# Patient Record
Sex: Female | Born: 1960 | Race: White | Hispanic: No | Marital: Single
Health system: Southern US, Community
[De-identification: ages and names within clinical notes are randomized; demographics above are authoritative.]

---

## 2005-03-25 ENCOUNTER — Ambulatory Visit: Payer: Self-pay

## 2005-07-23 ENCOUNTER — Ambulatory Visit: Payer: Self-pay | Admitting: Pain Medicine

## 2005-08-07 ENCOUNTER — Ambulatory Visit: Payer: Self-pay | Admitting: Pain Medicine

## 2005-09-11 ENCOUNTER — Ambulatory Visit: Payer: Self-pay | Admitting: Pain Medicine

## 2006-07-12 ENCOUNTER — Emergency Department: Payer: Self-pay | Admitting: Emergency Medicine

## 2006-09-16 IMAGING — CR DG LUMBAR SPINE AP/LAT/OBLIQUES W/ FLEX AND EXT
1 series · 5 of 5 positions shown · non-contrast
Comparison: none

REASON FOR EXAM: PAIN
COMMENTS:

[Series 1: view not recorded · 0.17mm/px · 5 of 5 slices shown]
[im 1/5]
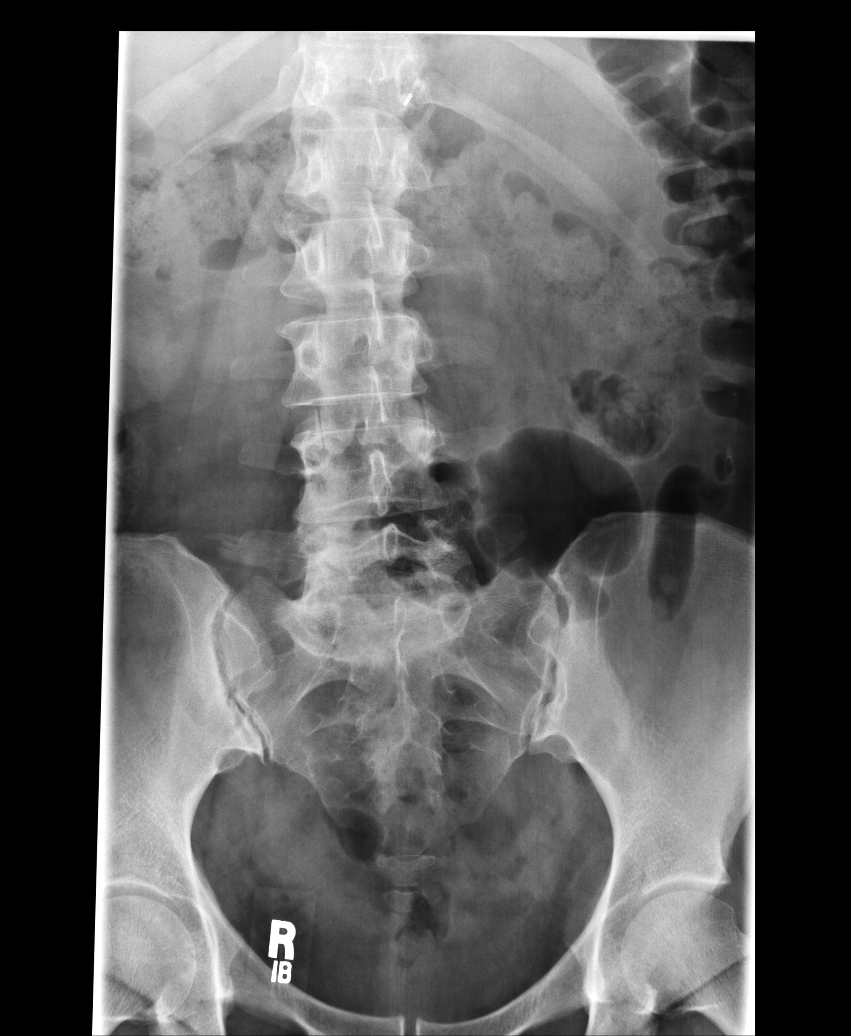
[im 2/5]
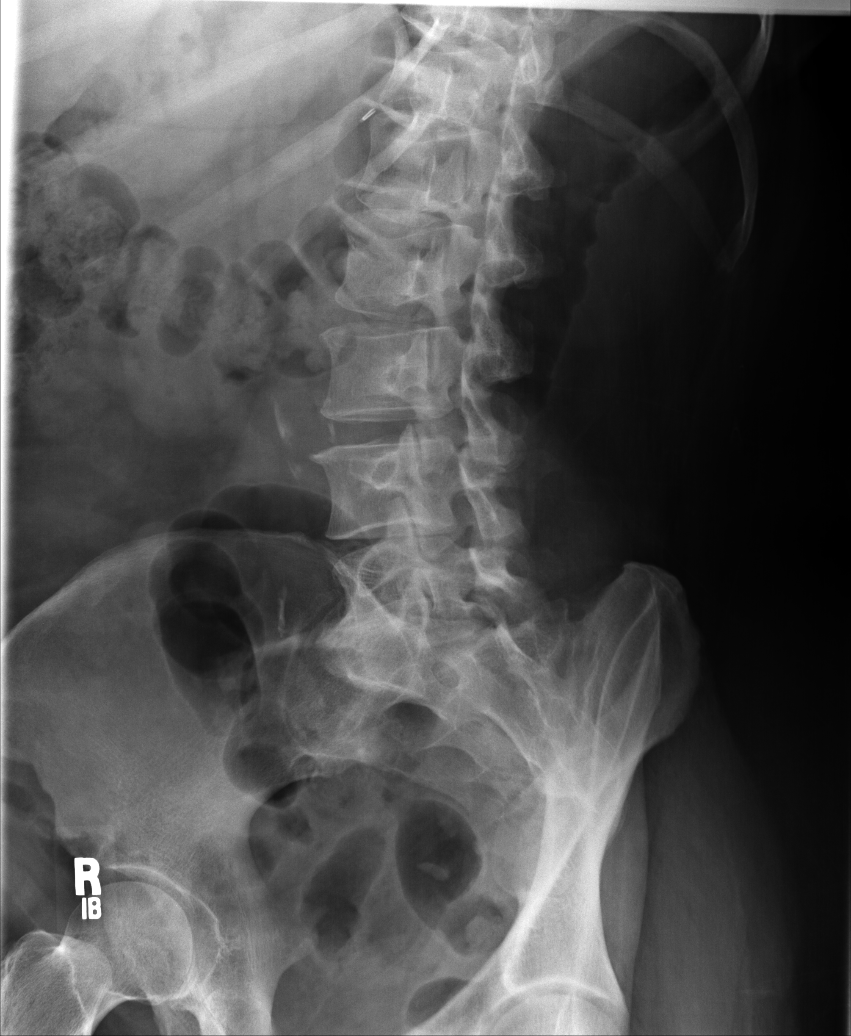
[im 3/5]
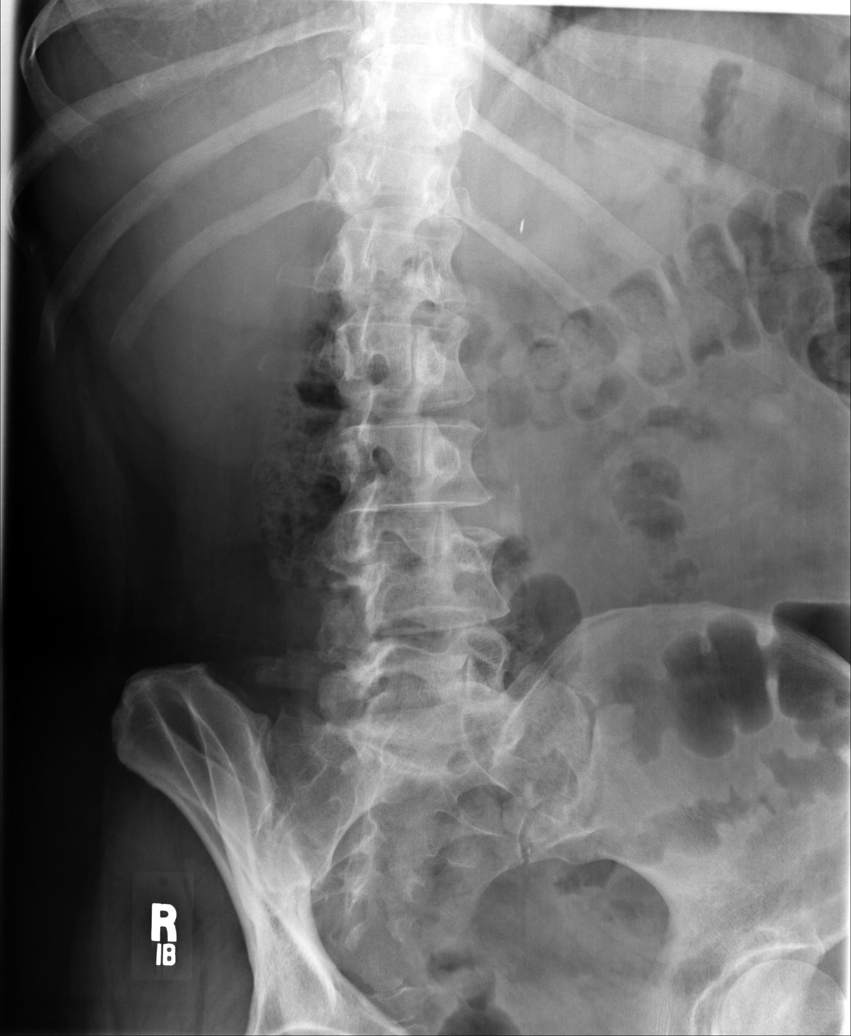
[im 4/5]
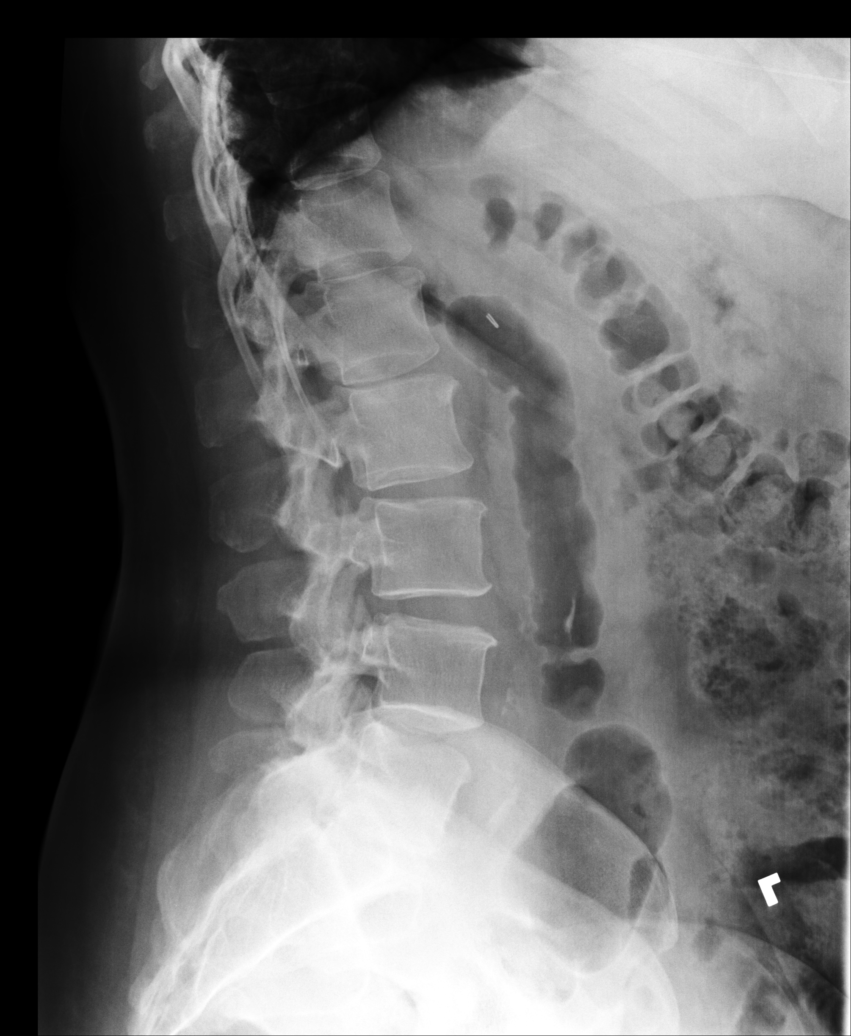
[im 5/5]
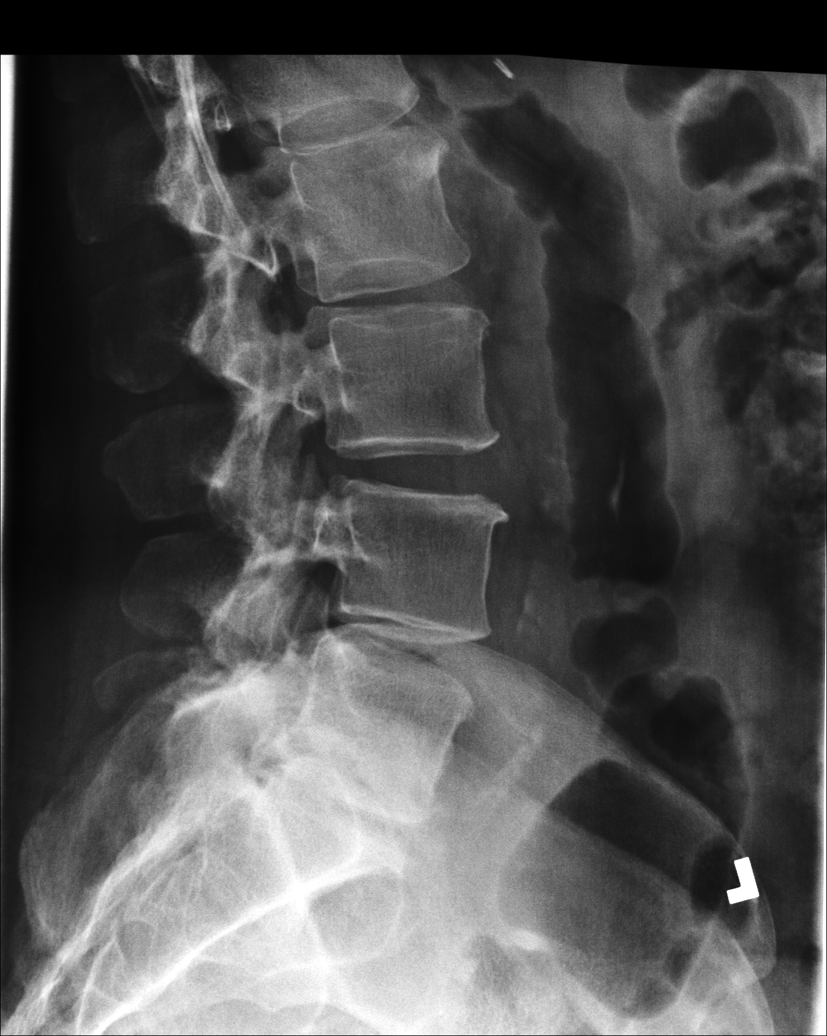

[5 of 5 positions shown; findings below may reference images not displayed]

PROCEDURE:     DXR - DXR LUMBAR SPINE WITH OBLIQUES  - March 25, 2005  [DATE]

RESULT:     The lumbar spine aligns anatomically without evidence of
fracture, pars defect, or spondylolisthesis.  There is mild intervertebral
disk space narrowing at L4-5 and L5-S1 with sclerotic spondylitic endplate
changes.  No suspicious paraspinal mass is noted.
IMPRESSION: Arthritic changes at L4-5 and L5-S1 without evidence of acute traumatic
abnormality.

## 2008-01-03 IMAGING — CR DG ABDOMEN 3V
1 series · 5 of 5 positions shown · non-contrast
Comparison: none

REASON FOR EXAM: Pain
COMMENTS:

[Series 1: view not recorded · 0.17mm/px · 5 of 5 slices shown]
[im 1/5]
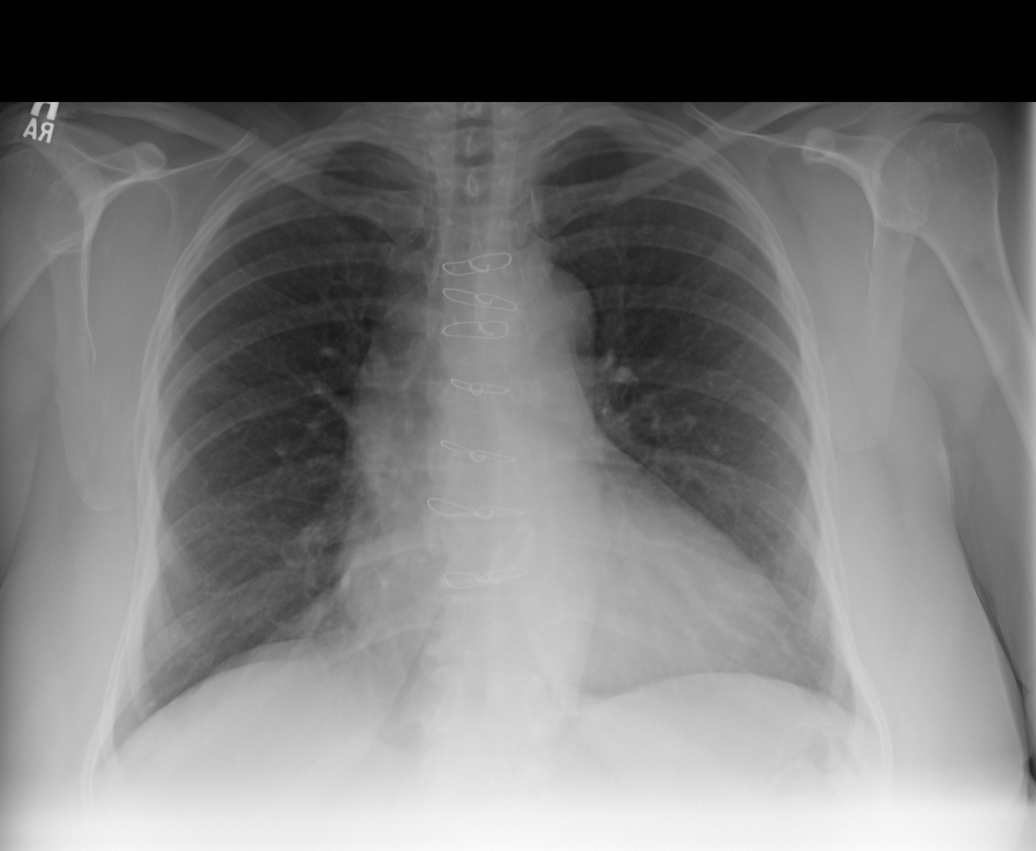
[im 2/5]
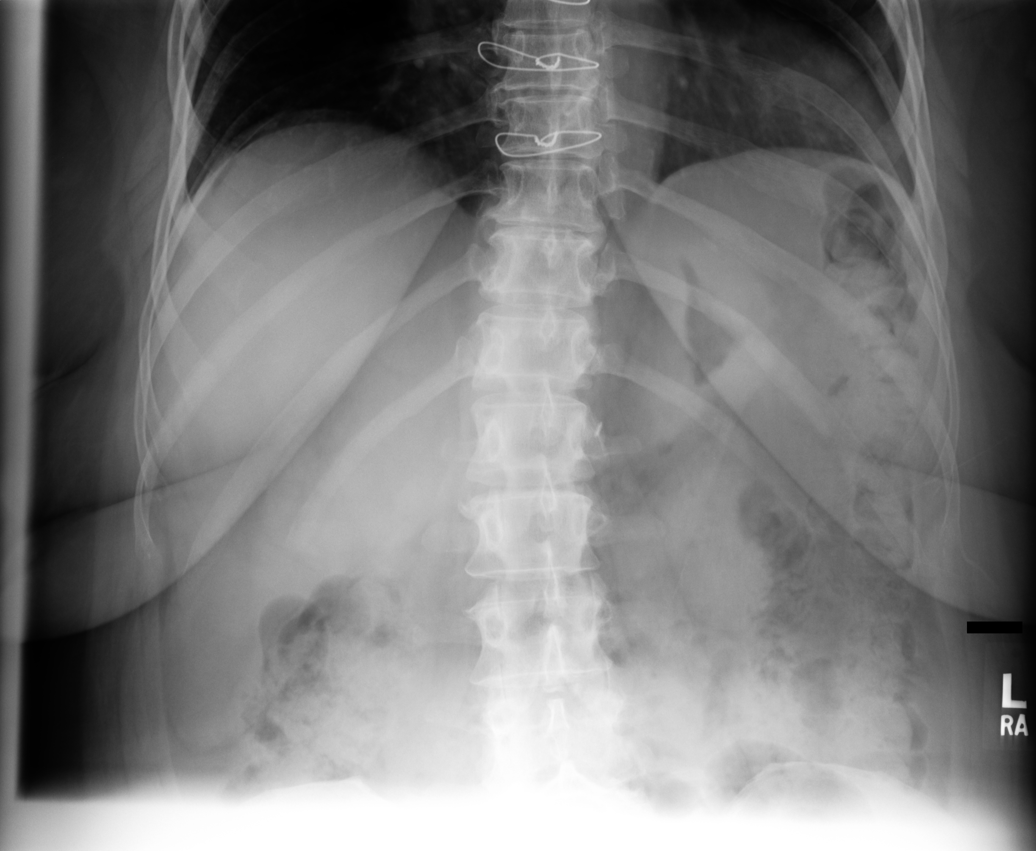
[im 3/5]
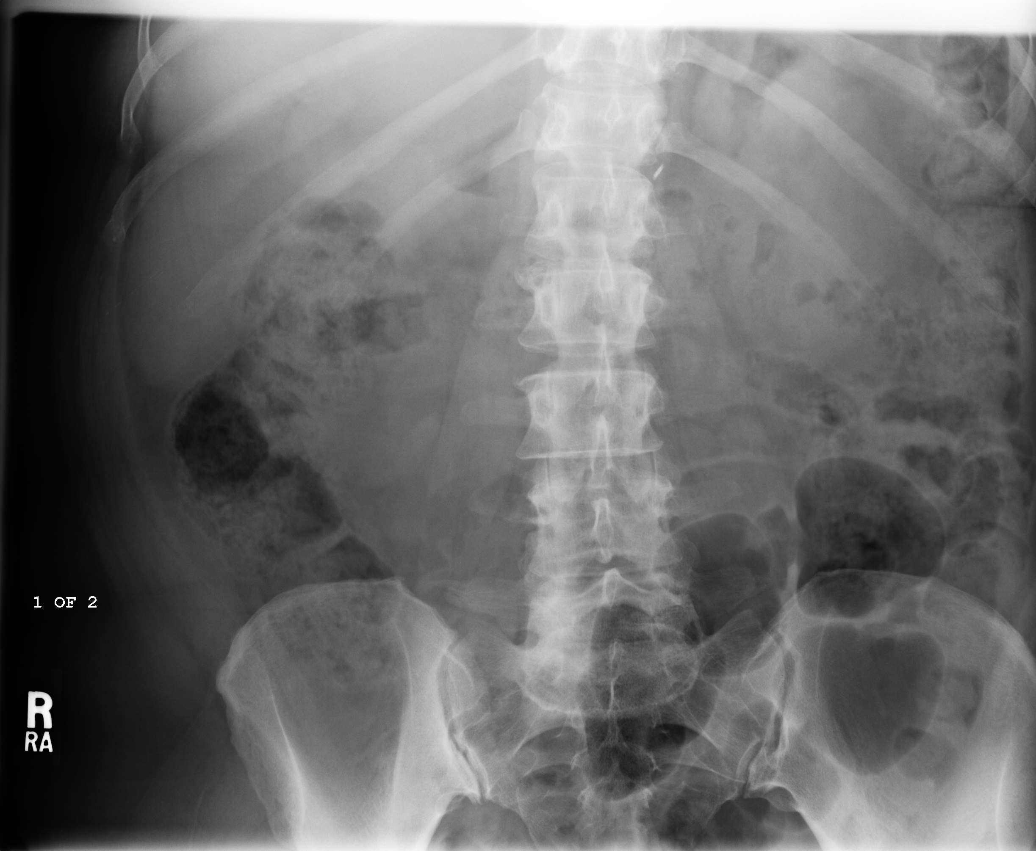
[im 4/5]
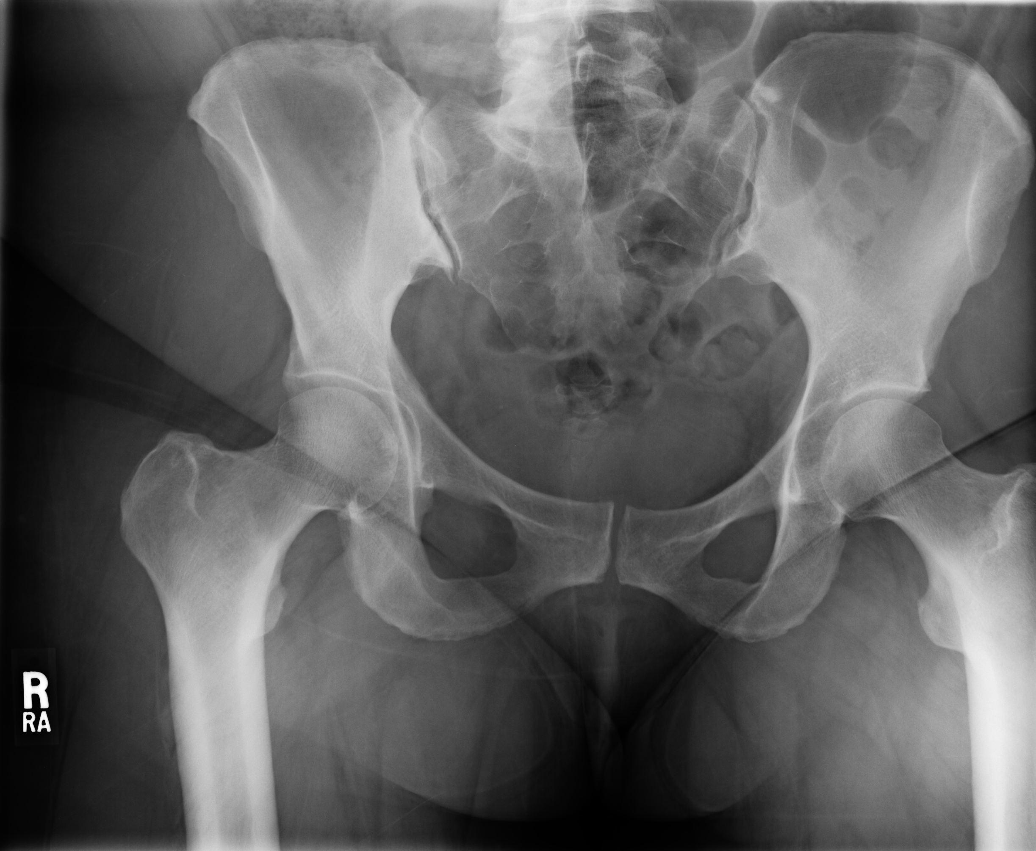
[im 5/5]
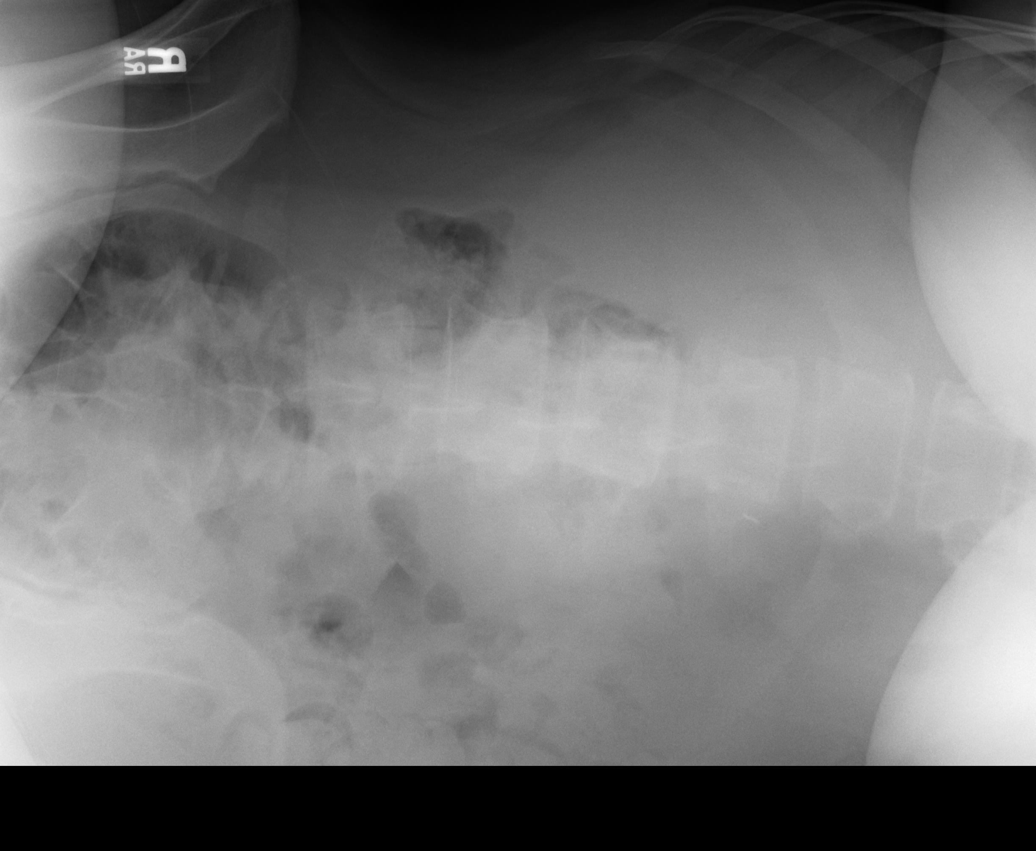

[5 of 5 positions shown; findings below may reference images not displayed]

PROCEDURE:     DXR - DXR ABDOMEN 3-WAY (INCL PA CXR)  - July 12, 2006  [DATE]

RESULT:          The patient is complaining of abdominal discomfort.

The chest film reveals the lungs to be clear.  The cardiopericardial
silhouette is mildly enlarged.  The patient has undergone prior CABG.

Views of the abdomen reveal a large amount of stool throughout the colon.  I
see no acute bony abnormality.  No abnormal calcifications are demonstrated.
 There is a surgical clip present along the LEFT aspect of the thoracolumbar
spinal junction.
IMPRESSION: 1.     The bowel gas pattern is consistent with constipation.
2.     I do not see acute cardiopulmonary disease.

## 2011-07-17 ENCOUNTER — Encounter: Payer: Self-pay | Admitting: Family Medicine

## 2011-07-18 ENCOUNTER — Ambulatory Visit: Payer: Self-pay | Admitting: Family Medicine

## 2015-12-24 ENCOUNTER — Emergency Department
Admission: EM | Admit: 2015-12-24 | Discharge: 2015-12-24 | Disposition: A | Discharge status: Home / Self Care | Payer: Medicare Other | Attending: Emergency Medicine | Admitting: Emergency Medicine

## 2015-12-24 DIAGNOSIS — L03116 Cellulitis of left lower limb: Secondary | ICD-10-CM | POA: Insufficient documentation

## 2015-12-24 DIAGNOSIS — W548XXA Other contact with dog, initial encounter: Secondary | ICD-10-CM

## 2015-12-24 MED ORDER — CEPHALEXIN 500 MG PO CAPS: 500.0000 mg | ORAL_CAPSULE | Freq: Four times a day (QID) | ORAL | Status: AC

## 2015-12-24 NOTE — ED Notes (Addendum)
Pt reports being scratched by a dog to left ankle 3 days ago. Pt now with swelling, redness and warmth to left ankle. Localized to area. Denies fever

## 2015-12-24 NOTE — ED Provider Notes (Signed)
[facility] Emergency Department Provider Note  ____________________________________________  Time seen: Approximately 8:24 AM  I have reviewed the triage vital signs and the nursing notes.   HISTORY  Chief Complaint Cellulitis   HPI Stephanie Berry is a 55 y.o. female is here with complaint of left ankle redness after being scratched by a puppy, one year old, 3 days ago. Patient states that she is up-to-date on her immunizations. She denies any fever. She is unaware of any drainage from the area. Patient states that she has been using ice to her ankle. She is unaware of any fever or chills. She states that the puppy is up-to-date on its immunizations as well. Currently she rates her pain as a 10 over 10.   No past medical history on file.  There are no active problems to display for this patient.   No past surgical history on file.  Current Outpatient Rx  Name  Route  Sig  Dispense  Refill  . cephALEXin (KEFLEX) 500 MG capsule   Oral   Take 1 capsule (500 mg total) by mouth 4 (four) times daily.   40 capsule   0     Allergies Methadone; Septra; and Sulfa antibiotics  No family history on file.  Social History Social History  Substance Use Topics  . Smoking status: Not on file  . Smokeless tobacco: Not on file  . Alcohol Use: Not on file    Review of Systems Constitutional: No fever/chills Cardiovascular: Denies chest pain. Respiratory: Denies shortness of breath. Gastrointestinal:   No nausea, no vomiting.  Musculoskeletal: Left ankle pain. Skin: Positive for puncture wound and erythema. Neurological: Negative for focal weakness or numbness.  10-point ROS otherwise negative.  ____________________________________________   PHYSICAL EXAM:  VITAL SIGNS: ED Triage Vitals  Enc Vitals Group     BP 12/24/15 0807 125/76 mmHg     Pulse Rate 12/24/15 0807 84     Resp 12/24/15 0807 20     Temp 12/24/15 0807 98.2 F (36.8 C)   Temp Source 12/24/15 0807 Oral     SpO2 12/24/15 0807 95 %     Weight 12/24/15 0807 200 lb (90.719 kg)     Height 12/24/15 0807 5\' 6"  (1.676 m)     Head Cir --      Peak Flow --      Pain Score 12/24/15 0808 10     Pain Loc --      Pain Edu? --      Excl. in GC? --     Constitutional: Alert and oriented. Well appearing and in no acute distress. Eyes: Conjunctivae are normal. PERRL. EOMI. Head: Atraumatic. Nose: No congestion/rhinnorhea. Neck: No stridor.   Cardiovascular: Normal rate, regular rhythm. Grossly normal heart sounds.  Good peripheral circulation. Respiratory: Normal respiratory effort.  No retractions. Lungs CTAB. Musculoskeletal: Moves upper and lower extremities without any difficulty. Normal gait was noted. Able to move ankle, left slowly due to injury but no restriction noted. Neurologic:  Normal speech and language. No gross focal neurologic deficits are appreciated. No gait instability. Skin:  Skin is warm, dry.There is a 4 cm erythematous area lateral aspect of the left ankle with 2 small punctures present. There is no drainage from this area. There is warmth and slight tenderness on palpation. Psychiatric: Mood and affect are normal. Speech and behavior are normal.  ____________________________________________   LABS (all labs ordered are listed, but only abnormal results are displayed)  Labs Reviewed -  No data to display  PROCEDURES  Procedure(s) performed: None  Critical Care performed: No  ____________________________________________   INITIAL IMPRESSION / ASSESSMENT AND PLAN / ED COURSE  Pertinent labs & imaging results that were available during my care of the patient were reviewed by me and considered in my medical decision making (see chart for details).  Patient states that she is up-to-date on her immunizations. Patient was started on Keflex 500 mg 4 times a day. She is to use warm compresses to the area and sitting in a tub of water was even  discussed. Patient is encouraged to elevate her legs when sitting watching TV. She is to start her antibiotics today, take Tylenol or ibuprofen as needed for pain. She is to return to the emergency room if any severe worsening, however she is to follow-up with her primary care doctor or [facility] if any continued problems.  A second visit to the patient's room was made to answer further questions after patient seemed to be confused. We discussed her antibiotic and that it was not related to any sulfa drugs. She does not have to use Epson salt to soak her foot. Warm moist compresses will work the same. Begin taking antibiotics today.  ____________________________________________   FINAL CLINICAL IMPRESSION(S) / ED DIAGNOSES  Final diagnoses:  Cellulitis of left ankle  Dog scratch      Tommi Rumps, PA-C 12/24/15 [phone]

## 2015-12-24 NOTE — ED Notes (Signed)
NAD noted at time of D/C. Pt denies questions or concerns. Pt ambulatory to the lobby at this time.  

## 2015-12-24 NOTE — Discharge Instructions (Signed)
Cellulitis Cellulitis is an infection of the skin and the tissue under the skin. The infected area is usually red and tender. This happens most often in the arms and lower legs. HOME CARE   Take your antibiotic medicine as told. Finish the medicine even if you start to feel better.  Keep the infected arm or leg raised (elevated).  Put a warm cloth on the area up to 4 times per day.  Only take medicines as told by your doctor.  Keep all doctor visits as told. GET HELP IF:  You see red streaks on the skin coming from the infected area.  Your red area gets bigger or turns a dark color.  Your bone or joint under the infected area is painful after the skin heals.  Your infection comes back in the same area or different area.  You have a puffy (swollen) bump in the infected area.  You have new symptoms.  You have a fever. GET HELP RIGHT AWAY IF:   You feel very sleepy.  You throw up (vomit) or have watery poop (diarrhea).  You feel sick and have muscle aches and pains.   This information is not intended to replace advice given to you by your health care provider. Make sure you discuss any questions you have with your health care provider.   Document Released: 02/12/2008 Document Revised: 05/17/2015 Document Reviewed: 11/11/2011 Elsevier Interactive Patient Education 2016 [employer]   Warm moist compresses to the area frequently or soak your ankle in warm water. Elevate foot when sitting. Begin taking antibiotics as directed. Tylenol or ibuprofen as needed for pain. Follow-up with your doctor in [place] if no improvement.

## 2015-12-24 NOTE — ED Notes (Signed)
PA notified of patient continuing to have questions regarding D/C, PA to bedside to answer patient questions. This RN instructed pt's daughter that this RN looked up and a pharmacy was shown to open at 09[address] on [address].
